# Patient Record
Sex: Male | Born: 1970 | Race: Black or African American | Hispanic: Yes | Marital: Single | Smoking: Current every day smoker
Health system: Southern US, Community
[De-identification: ages and names within clinical notes are randomized; demographics above are authoritative.]

---

## 2014-11-25 ENCOUNTER — Emergency Department (HOSPITAL_BASED_OUTPATIENT_CLINIC_OR_DEPARTMENT_OTHER)
Admission: EM | Admit: 2014-11-25 | Discharge: 2014-11-25 | Disposition: A | Discharge status: Home / Self Care | Payer: Self-pay | Attending: Emergency Medicine | Admitting: Emergency Medicine

## 2014-11-25 ENCOUNTER — Emergency Department (HOSPITAL_BASED_OUTPATIENT_CLINIC_OR_DEPARTMENT_OTHER): Payer: Self-pay

## 2014-11-25 ENCOUNTER — Encounter (HOSPITAL_BASED_OUTPATIENT_CLINIC_OR_DEPARTMENT_OTHER): Payer: Self-pay | Admitting: *Deleted

## 2014-11-25 DIAGNOSIS — Y9389 Activity, other specified: Secondary | ICD-10-CM | POA: Insufficient documentation

## 2014-11-25 DIAGNOSIS — Z720 Tobacco use: Secondary | ICD-10-CM | POA: Insufficient documentation

## 2014-11-25 DIAGNOSIS — Y92410 Unspecified street and highway as the place of occurrence of the external cause: Secondary | ICD-10-CM | POA: Insufficient documentation

## 2014-11-25 DIAGNOSIS — S199XXA Unspecified injury of neck, initial encounter: Secondary | ICD-10-CM | POA: Insufficient documentation

## 2014-11-25 DIAGNOSIS — M791 Myalgia, unspecified site: Secondary | ICD-10-CM

## 2014-11-25 DIAGNOSIS — S39012A Strain of muscle, fascia and tendon of lower back, initial encounter: Secondary | ICD-10-CM | POA: Insufficient documentation

## 2014-11-25 DIAGNOSIS — Y998 Other external cause status: Secondary | ICD-10-CM | POA: Insufficient documentation

## 2014-11-25 MED ORDER — CYCLOBENZAPRINE HCL 10 MG PO TABS: 5.0000 mg | ORAL_TABLET | Freq: Every day | ORAL | Status: AC
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
## 2014-11-25 NOTE — ED Notes (Signed)
MD at bedside. 

## 2014-11-25 NOTE — Discharge Instructions (Signed)
Please call your doctor for a followup appointment within 24-48 hours. When you talk to your doctor please let them know that you were seen in the emergency department and have them acquire all of your records so that they can discuss the findings with you and formulate a treatment plan to fully care for your new and ongoing problems. Please follow-up with health and wellness Center Please rest and stay hydrated Please follow-up with orthopedics Please avoid any physical or strenuous activity-please no heavy lifting Please apply heat and massage with icy hot ointment for muscle relief Please take muscle relaxer as prescribed-no drinking alcohol, driving, operating any machinery while taking the medication. This can cause drowsiness of please no taking other medications that can cause drowsiness with this medication.  Please continue to monitor symptoms closely and if symptoms are to worsen or change (fever greater than 101, chills, sweating, nausea, vomiting, chest pain, shortness of breathe, difficulty breathing, weakness, numbness, tingling, worsening or changes to pain pattern, fall, injury, loss of sensation, inability to control urine and bowel movements, headache, dizziness, inability food fluids down) please report back to the Emergency Department immediately.   Lumbosacral Strain Lumbosacral strain is a strain of any of the parts that make up your lumbosacral vertebrae. Your lumbosacral vertebrae are the bones that make up the lower third of your backbone. Your lumbosacral vertebrae are held together by muscles and tough, fibrous tissue (ligaments).  CAUSES  A sudden blow to your back can cause lumbosacral strain. Also, anything that causes an excessive stretch of the muscles in the low back can cause this strain. This is typically seen when people exert themselves strenuously, fall, lift heavy objects, bend, or crouch repeatedly. RISK FACTORS  Physically demanding work.  Participation in  pushing or pulling sports or sports that require a sudden twist of the back (tennis, golf, baseball).  Weight lifting.  Excessive lower back curvature.  Forward-tilted pelvis.  Weak back or abdominal muscles or both.  Tight hamstrings. SIGNS AND SYMPTOMS  Lumbosacral strain may cause pain in the area of your injury or pain that moves (radiates) down your leg.  DIAGNOSIS Your health care provider can often diagnose lumbosacral strain through a physical exam. In some cases, you may need tests such as X-ray exams.  TREATMENT  Treatment for your lower back injury depends on many factors that your clinician will have to evaluate. However, most treatment will include the use of anti-inflammatory medicines. HOME CARE INSTRUCTIONS   Avoid hard physical activities (tennis, racquetball, waterskiing) if you are not in proper physical condition for it. This may aggravate or create problems.  If you have a back problem, avoid sports requiring sudden body movements. Swimming and walking are generally safer activities.  Maintain good posture.  Maintain a healthy weight.  For acute conditions, you may put ice on the injured area.  Put ice in a plastic bag.  Place a towel between your skin and the bag.  Leave the ice on for 20 minutes, 2-3 times a day.  When the low back starts healing, stretching and strengthening exercises may be recommended. SEEK MEDICAL CARE IF:  Your back pain is getting worse.  You experience severe back pain not relieved with medicines. SEEK IMMEDIATE MEDICAL CARE IF:   You have numbness, tingling, weakness, or problems with the use of your arms or legs.  There is a change in bowel or bladder control.  You have increasing pain in any area of the body, including your belly (abdomen).  You notice shortness of breath, dizziness, or feel faint.  You feel sick to your stomach (nauseous), are throwing up (vomiting), or become sweaty.  You notice discoloration of  your toes or legs, or your feet get very cold. MAKE SURE YOU:   Understand these instructions.  Will watch your condition.  Will get help right away if you are not doing well or get worse. Document Released: 05/04/2005 Document Revised: 07/30/2013 Document Reviewed: 03/13/2013 [facility] Patient Information 2015 [place], [place]. This information is not intended to replace advice given to you by your health care provider. Make sure you discuss any questions you have with your health care provider.   Emergency Department Resource Guide 1) Find a Doctor and Pay Out of Pocket Although you won't have to find out who is covered by your insurance plan, it is a good idea to ask around and get recommendations. You will then need to call the office and see if the doctor you have chosen will accept you as a new patient and what types of options they offer for patients who are self-pay. Some doctors offer discounts or will set up payment plans for their patients who do not have insurance, but you will need to ask so you aren't surprised when you get to your appointment.  2) Contact Your Local Health Department Not all health departments have doctors that can see patients for sick visits, but many do, so it is worth a call to see if yours does. If you don't know where your local health department is, you can check in your phone book. The CDC also has a tool to help you locate your state's health department, and many state websites also have listings of all of their local health departments.  3) Find a Walk-in Clinic If your illness is not likely to be very severe or complicated, you may want to try a walk in clinic. These are popping up all over the country in pharmacies, drugstores, and shopping centers. They're usually staffed by nurse practitioners or physician assistants that have been trained to treat common illnesses and complaints. They're usually fairly quick and inexpensive. However, if you have  serious medical issues or chronic medical problems, these are probably not your best option.  No Primary Care Doctor: - Call Health Connect at  [phone] - they can help you locate a primary care doctor that  accepts your insurance, provides certain services, etc. - Physician Referral Service- [phone]  Chronic Pain Problems: Organization         Address  Phone   Notes  [facility]  [phone] Patients need to be referred by their primary care doctor.   Medication Assistance: Organization         Address  Phone   Notes  [facility] Medication [facility] [address] [place] [place], [place] [address] [phone] --Must be a resident of [facility] -- Must have NO insurance coverage whatsoever (no Medicaid/ Medicare, etc.) -- The pt. MUST have a primary care doctor that directs their care regularly and follows them in the community   [facility]  [phone]   [employer]  [phone]    Agencies that provide inexpensive medical care: Organization         Address  Phone   Notes  [facility]  [phone]   [facility]    [phone]   [facility] [address] [place],  [place] [address] [phone]   [facility] [address], [place] [phone]   Planned Parenthood    [phone]   [facility]    [phone]   [facility]  [address], [place] Phone:  [phone], Fax:  [phone] Hours of Operation:  9 am - 6 pm, M-F.  Also accepts Medicaid/Medicare and self-pay.  [facility]  [address], [place] Phone: [phone], Fax: [phone]. Hours of Operation:  8:30 am - 5:30 pm, M-F.  Also accepts Medicaid and self-pay.  [facility] [place] [address] [place] Phone: [phone]   [facility] [address] Natasha Bence [place], [place] [phone], Ext. [phone] Mondays & Thursdays: 7-9 AM.  First 15 patients are seen on a first come, first serve basis.    Medicaid-accepting [facility] Providers:  Organization         Address  Phone   Notes  [facility] [address], [place] [phone] Also accepts self-pay patients.  [facility] [address] Laurell Josephs [place], [place]  [phone]   [facility] [address], [place] [phone]   [facility] [address], [place] [phone]   Renaye Rakers [address], [place]   [phone] Only accepts [place] patients after they have their name applied to their card.   Self-Pay (no insurance) in [place]:  Organization         Address  Phone   Notes  Sickle Cell Patients, [facility] [address] [place], [place] [phone]   [facility] [address] [place], [place] [phone]   [facility] [place]  [address], [place] [phone]   [facility]/Dr. Osei-Bonsu  [address] [place] or [address], [place] [phone] Phone number for both [address] [place] and [place] locations is the same.  [facility] [address], [place] [phone]   [facility] [address] [place] or [address] [phone]   [facility] [address], [place] [phone], phone; [phone], fax Sees patients 1st and 3rd Saturday of every month.  Must not qualify for public or private insurance (i.e. Medicaid, Medicare, [place] Health Choice, Veterans' Benefits)  Household income should be no more than 200% of the poverty level The clinic cannot treat you if you are pregnant or think you are pregnant   Sexually transmitted diseases are not treated at the clinic.    Dental Care: Organization         Address  Phone  Notes  [facility] Department of [facility] [address] [place], [place] [phone] Accepts children up to age 44 who are enrolled in [place] or [place] Health Choice; pregnant women with a Medicaid card; and children who have applied for Medicaid or [place] Health Choice, but were declined, whose parents can pay a reduced fee at time of service.  [facility]  [address], [place] [phone] Accepts children up to age 5 who are enrolled in [place] or  Health Choice; pregnant  women with a Medicaid card; and children who have applied for Medicaid or [place] Health Choice, but were declined, whose parents can pay a reduced fee at time of service.  [facility]  [address] [place], [place] [phone] Patients are seen by appointment only. Walk-ins are not accepted. [facility] will see patients 40 years of age and older. Monday - Tuesday (8am-5pm) Most Wednesdays (8:30-5pm) $30 per visit, cash only  [facility] Access [facility]  [address], [facility] [phone] Patients are seen by appointment only. Walk-ins are not accepted. [facility] will see patients 50 years of age and older. One Wednesday Evening (Monthly: Volunteer Based).  $30 per visit, cash only  [employer]  [phone] for adults; Children under age 4, call Graduate Pediatric Dentistry at [phone]. Children aged 74-14, please call [phone] to request a pediatric application.  Dental services are provided in all areas of dental care including fillings, crowns and bridges, complete and partial dentures, implants, gum treatment, root canals, and extractions. Preventive care is also provided. Treatment is provided to both adults and children. Patients  are selected via a lottery and there is often a waiting list.   [facility] [address] [place]  [phone] [email or web address]   [facility] [address] [place], [place] [phone], Ext. [phone] Second and Fourth Thursday of each month, opens at 6:30 AM; Clinic ends at 9 AM.  Patients are seen on a first-come first-served basis, and a limited number are seen during each clinic.   [facility]  [address] Ether Griffins [place], [place] [phone]   Eligibility Requirements You must have lived in [place], [place], or [place] counties for at least the last three months.   You cannot be eligible for state or federal sponsored [place], including [employer], [place], or [employer].   You generally cannot be eligible for healthcare insurance through your employer.    How to apply: Eligibility screenings are held every Tuesday and Wednesday afternoon from 1:00 pm until 4:00 pm. You do not need an appointment for the interview!  [facility] [address] [place], [place] [phone]   [facility]  [phone]   [facility]  [phone]   [facility]  [phone]    Behavioral Health Resources in the Community: Intensive Outpatient Programs Organization         Address  Phone  Notes  [facility] [address], [place], [place] [phone]   [facility] [address] [place], [place] [phone]   [facility] [address] [place], [place]  [phone]   [facility] Health [address],  [place], [place] [phone] or [phone]   Substance Abuse Resources Organization         Address  Phone  Notes  [facility]  [phone]   [facility]  [phone]   [facility] [place]  [phone]   Floydene Flock  [phone]    Residential & Outpatient Substance Abuse Program  [phone]   Psychological Services Organization         Address  Phone  Notes  [facility]  [phone]   [facility]  [phone]   [facility] Mental Health [address], [place] [phone] or [phone]    Mobile Crisis  Teams Organization         Address  Phone  Notes  [facility], Mobile Crisis Care Unit  [phone]   [facility]  [address] [place], [place] [phone]   [facility] DeEsch [address] [place] [phone]    Self-Help/Support Groups Organization         Address  Phone             Notes  [facility] [place] - variety of support groups  [phone]- [phone] Call for more information  Narcotics Anonymous (NA), [facility] [address], [place]  2 meetings at this location   Statisticianesidential Treatment Programs Organization         Address  Phone  Notes  [facility] [address],    [place]  [phone]   [facility]  [address], [place] [phone], [place], [place] [phone]   [facility] [address], [place] [phone] Admissions: 8am-3pm M-F  [facility] [address]    [place], [place] [phone]   [facility] [address], [place], [place] [phone]   [facility] [address]  [place], [place] [phone]   [facility] - Intensive Outpatient [address], [place], [place] [phone]   [facility] ([facility] [address]  [place], [place] [phone] or [phone]   [facility] ([facility]) [address] [place], [place] [phone] Accepts Medicaid  [facility] [address]  [place] [phone] Substance Abuse/Addiction Treatment   [facility]  Resources Organization         Address  Phone  Notes  [facility]  [phone]) [phone]   Angie FavaJulie Brannon, PhD [address] [place], [place]   [phone]) [phone] or [phone]) [phone]   [facility]   [address] [place], [place] [phone]) [phone]   [facility] [address], [place], [place] [phone]) [phone] Insurance/Medicaid/sponsorship through [facility]  [facility] [address]                                    [place], [place] [phone] Therapy/tele-psych/case  [facility] [address]   [employer], [place] [phone](336) [phone]    Dr. Lolly MustacheArfeen  [phone]   [facility] [place]  [employer] [facility] 1) [address], [place] 2) [address], [place] 3)  [address], [place] [phone]) [phone]  [phone]   [facility] Child Abuse Hotline [phone]) [phone] or [phone](336) [phone] (After Hours)
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
